# Patient Record
Sex: Male | Born: 1990 | Race: White | Hispanic: No | Marital: Single | Smoking: Never smoker
Health system: Southern US, Community
[De-identification: ages and names within clinical notes are randomized; demographics above are authoritative.]

---

## 2008-04-13 ENCOUNTER — Ambulatory Visit: Payer: Self-pay | Admitting: Unknown Physician Specialty

## 2008-04-20 ENCOUNTER — Ambulatory Visit: Payer: Self-pay | Admitting: Unknown Physician Specialty

## 2009-04-13 ENCOUNTER — Ambulatory Visit: Payer: Self-pay

## 2010-01-17 ENCOUNTER — Ambulatory Visit: Payer: Self-pay | Admitting: General Surgery

## 2010-01-31 ENCOUNTER — Ambulatory Visit: Payer: Self-pay | Admitting: General Surgery

## 2010-08-14 ENCOUNTER — Ambulatory Visit: Payer: Self-pay | Admitting: Internal Medicine

## 2011-09-30 IMAGING — CT CT HEAD WITHOUT CONTRAST
2 series · 16 of 30 positions shown, 20 images · non-contrast
Comparison: none

REASON FOR EXAM: [id number] head contusion
COMMENTS:

PROCEDURE:     CT  - CT HEAD WITHOUT CONTRAST  - August 14, 2010  [DATE]
RESULT:     Comparison:  None
TECHNIQUE: Multiple axial images from the foramen magnum to the vertex were
obtained without IV contrast.

[Series 2: without · axial · non-contrast · 0.43mm/px · z∈[+294,+424]mm · 13 of 32 slices shown, 17 images]
[im 3/32  brain]
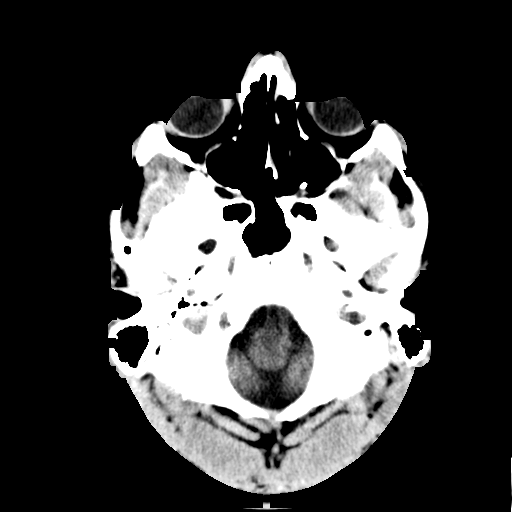
[im 3/32  bone]
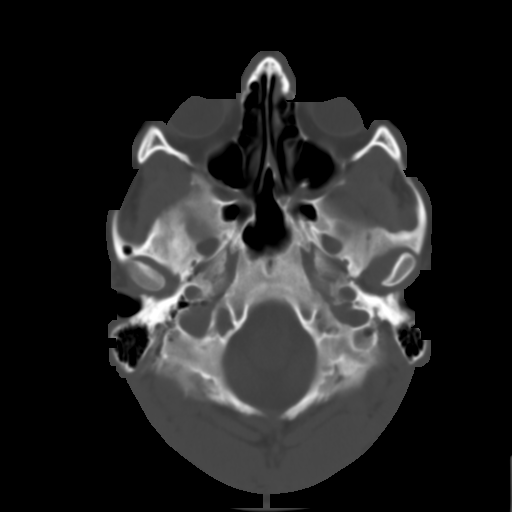
[im 5/32  brain]
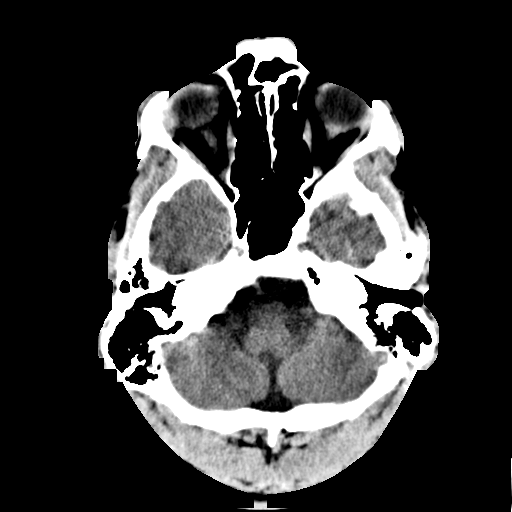
[im 7/32  brain]
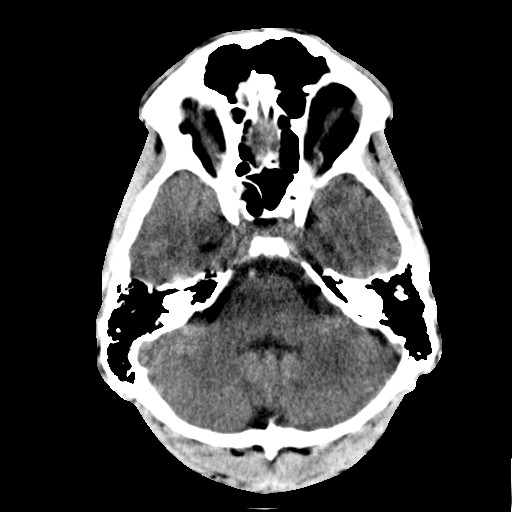
[im 9/32  brain]
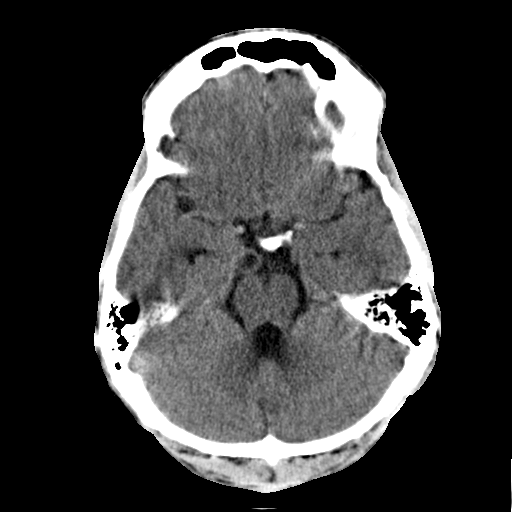
[im 12/32  brain]
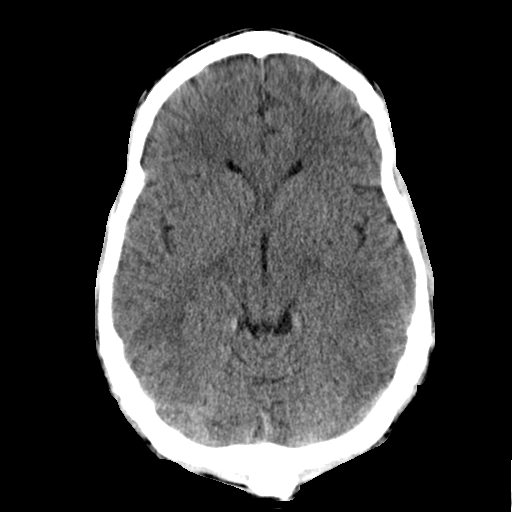
[im 12/32  bone]
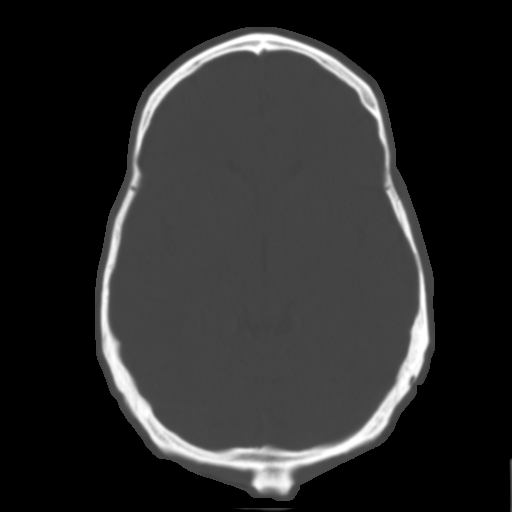
[im 14/32  brain]
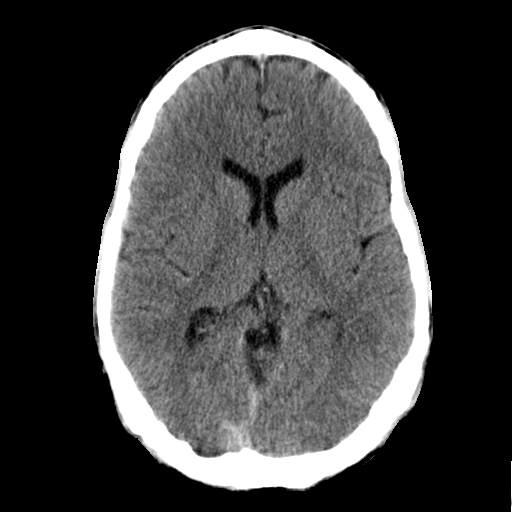
[im 16/32  brain]
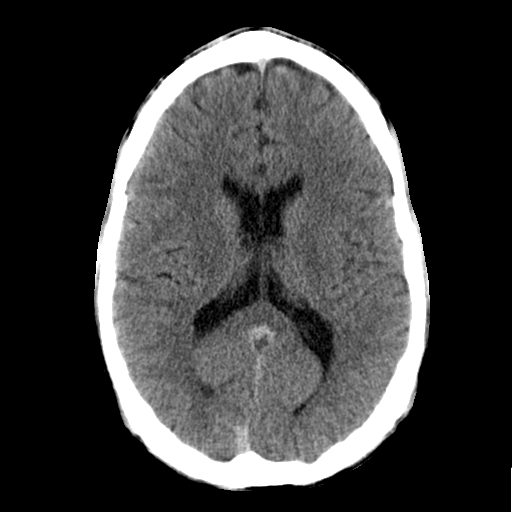
[im 18/32  brain]
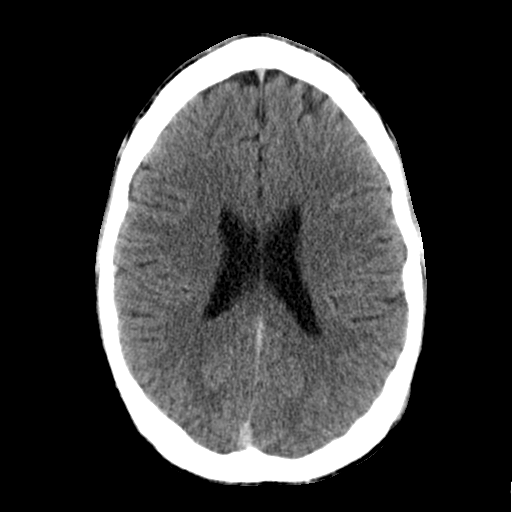
[im 20/32  brain]
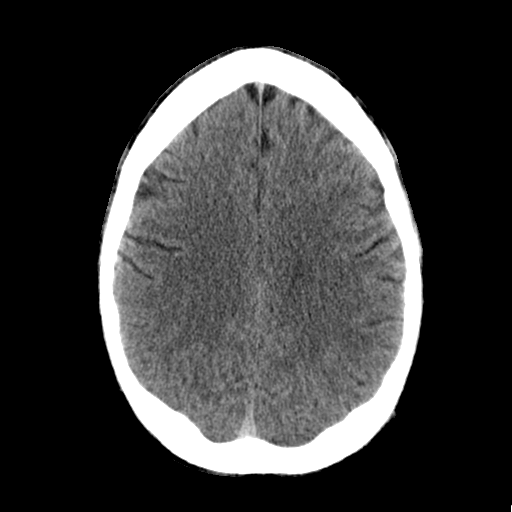
[im 20/32  bone]
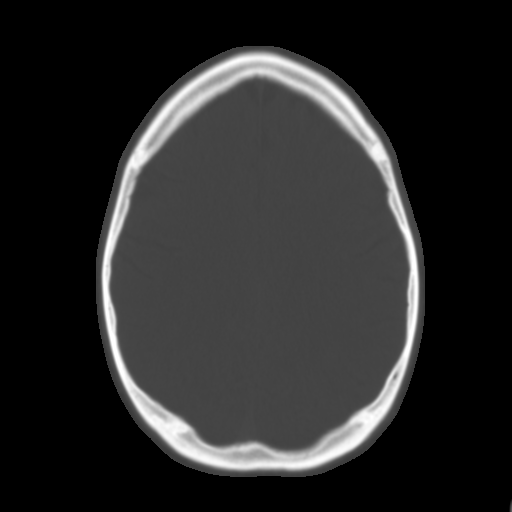
[im 23/32  brain]
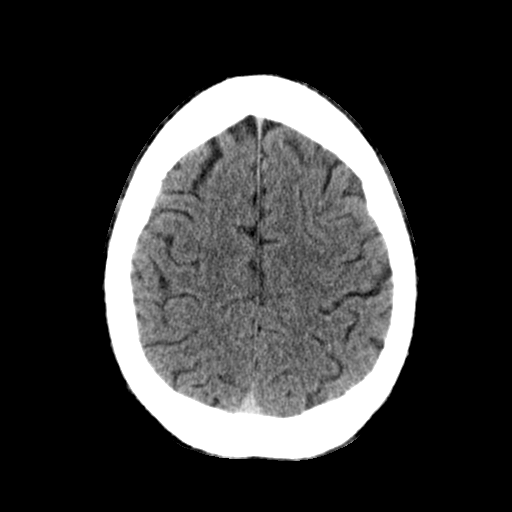
[im 25/32  brain]
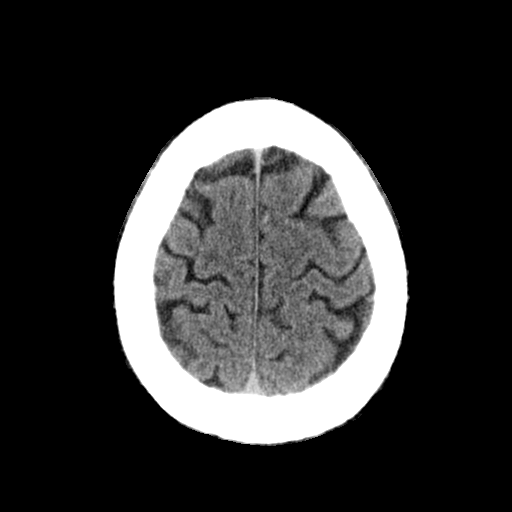
[im 27/32  brain]
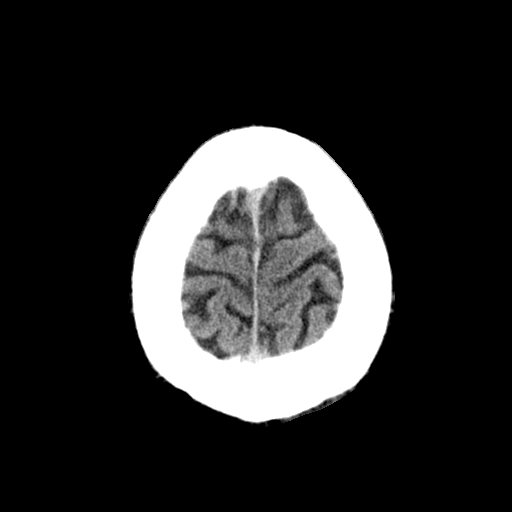
[im 29/32  brain]
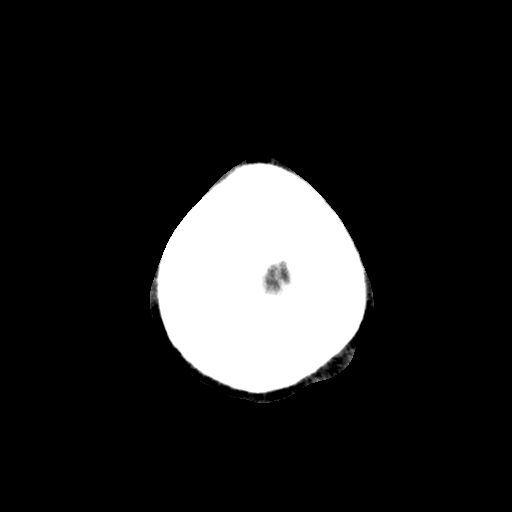
[im 29/32  bone]
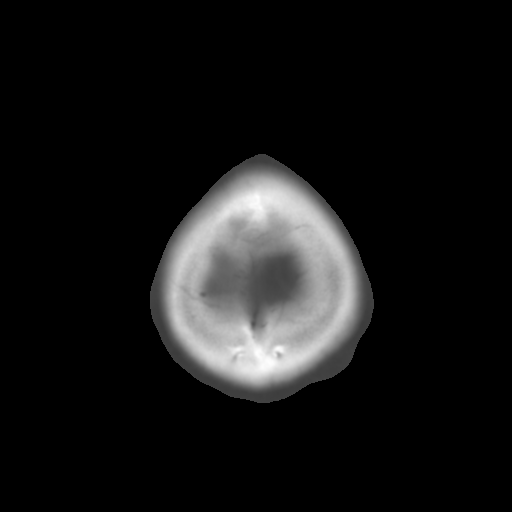

[Series 3: bone · axial · 0.43mm/px · z∈[+294,+338]mm · 3 of 32 slices shown]
[im 3/32  bone]
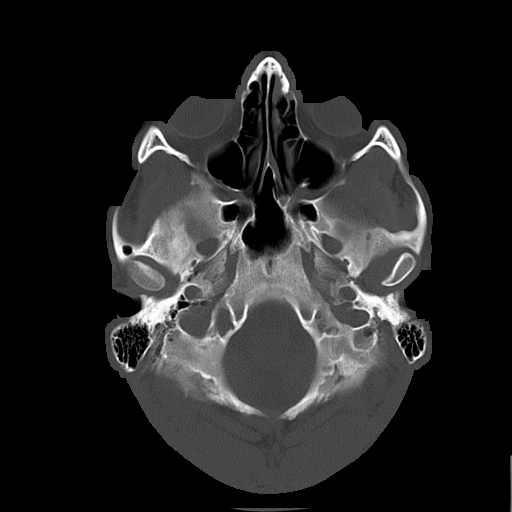
[im 7/32  bone]
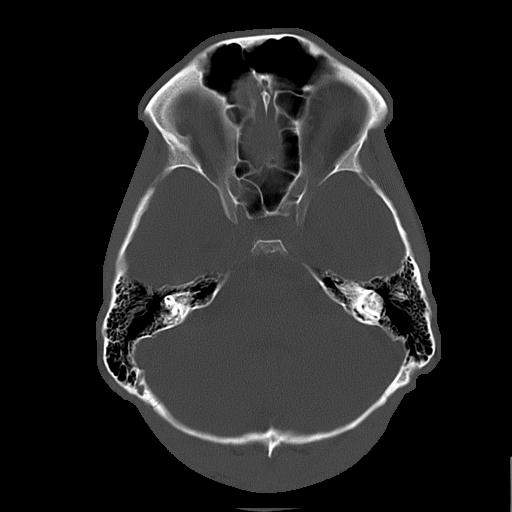
[im 12/32  bone]
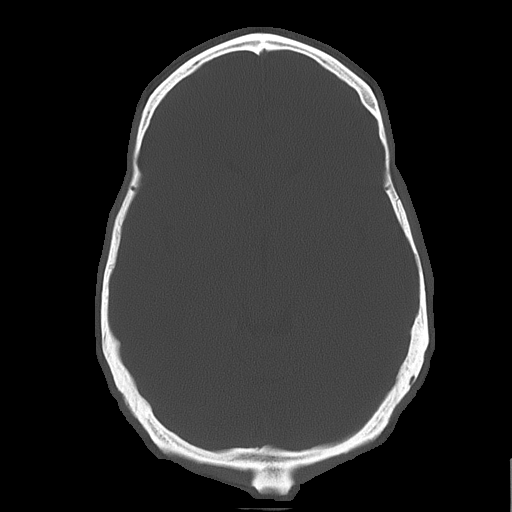

[16 of 30 positions shown; findings below may reference images not displayed]

FINDINGS: There is no evidence of mass effect, midline shift, or extra-axial fluid
collections.  There is no evidence of a space-occupying lesion or
intracranial hemorrhage. There is no evidence of a cortical-based area of
acute infarction.

The ventricles and sulci are appropriate for the patient's age. The basal
cisterns are patent.

Visualized portions of the orbits are unremarkable. The visualized portions
of the paranasal sinuses and mastoid air cells are unremarkable.

The osseous structures are unremarkable.
IMPRESSION: No acute intracranial process.

## 2014-06-21 ENCOUNTER — Other Ambulatory Visit (INDEPENDENT_AMBULATORY_CARE_PROVIDER_SITE_OTHER): Payer: No Typology Code available for payment source

## 2014-06-21 ENCOUNTER — Encounter: Payer: Self-pay | Admitting: *Deleted

## 2014-06-21 ENCOUNTER — Encounter: Payer: Self-pay | Admitting: Family Medicine

## 2014-06-21 ENCOUNTER — Ambulatory Visit (INDEPENDENT_AMBULATORY_CARE_PROVIDER_SITE_OTHER): Payer: No Typology Code available for payment source | Admitting: Family Medicine

## 2014-06-21 VITALS — BP 140/82 | HR 89 | Ht 74.0 in | Wt 215.0 lb

## 2014-06-21 DIAGNOSIS — G5622 Lesion of ulnar nerve, left upper limb: Secondary | ICD-10-CM

## 2014-06-21 DIAGNOSIS — M25522 Pain in left elbow: Secondary | ICD-10-CM

## 2014-06-21 NOTE — Patient Instructions (Signed)
Good to see you.  Happy holidays!  Ice 20 minutes 2 times daily. Usually after activity and before bed. Exercises 3 times a week.   Compression sleeve try at dicks, with activity and when flared up.  Try the pennsaid twice daily.  Consider turmeric 500mg  twice daily  See me again in  3 weeks.

## 2014-06-21 NOTE — Progress Notes (Signed)
  Tawana ScaleZach Issachar Broady D.O. [facility] [address] [place], [place] [address] Phone: [phone]) [phone] Subjective:     CC: left elbow pain  NUU:[id number]:Subjective Victor Mcintosh is a 23 y.o. male coming in with complaint of left elbow pain. Has had it for greater than 6 months. Patient states that it is intermittent. Patient states that it hurts when he hits it just right. Patient does not know the specific area. When it happens it feels like electrical shocks that seems to go down his arm and then states localize. Pain seems to go away after multiple seconds if not minutes. Patient then as a dull ache. Nothing that stops him from any activity. Patient states when the severity occurs at 6 or 7 out of 10. Denies any weakness or numbness in the extremity. Patient does do a lot of manual labor though. Patient is also in school and does put a lot of pressure on his elbows when studying he states. No pain that wakes him up at night.     Past medical history, social, surgical and family history all reviewed in electronic medical record.   Review of Systems: No headache, visual changes, nausea, vomiting, diarrhea, constipation, dizziness, abdominal pain, skin rash, fevers, chills, night sweats, weight loss, swollen lymph nodes, body aches, joint swelling, muscle aches, chest pain, shortness of breath, mood changes.   Objective Blood pressure 140/82, pulse 89, height 6\' 2"  (1.88 m), weight 215 lb (97.523 kg), SpO2 98 %.  General: No apparent distress alert and oriented x3 mood and affect normal, dressed appropriately.  HEENT: Pupils equal, extraocular movements intact  Respiratory: Patient's speak in full sentences and does not appear short of breath  Cardiovascular: No lower extremity edema, non tender, no erythema  Skin: Warm dry intact with no signs of infection or rash on extremities or on axial skeleton.  Abdomen: Soft nontender  Neuro: Cranial nerves II through XII are intact, neurovascularly intact  in all extremities with 2+ DTRs and 2+ pulses.  Lymph: No lymphadenopathy of posterior or anterior cervical chain or axillae bilaterally.  Gait normal with good balance and coordination.  MSK:  Non tender with full range of motion and good stability and symmetric strength and tone of shoulders, wrist, hip, knee and ankles bilaterally.  Elbow:left Unremarkable to inspection. Range of motion full pronation, supination, flexion, extension. Strength is full to all of the above directions Stable to varus, valgus stress. Negative moving valgus stress test. No discrete areas of tenderness to palpation. Ulnar nerve does have subluxation positivecubital tunnel Tinel's.  Musculoskeletal ultrasound was performed and interpreted by Terrilee FilesZach Giordano Getman D.O.   Elbow: left Lateral epicondyle and common extensor tendon origin visualized.  No edema, effusions, or avulsions seen.  Radial head unremarkable and located in annular ligament Medial epicondyle and common flexor tendon origin visualized.  No edema, effusions, or avulsions seen. Ulnar nerve in cubital tunnel unremarkable. Olecranon and triceps insertion visualized and unremarkable without edema, effusion, or avulsion.  No signs olecranon bursitis. Patient's ulnar nerve does have hypoechoic changes. With full extension there is some mild subluxation that occurs. This does cause patient have the symptomatology. Power doppler signal normal.  IMPRESSION:  Ulnar subluxation     Impression and Recommendations:     This case required medical decision making of moderate complexity.

## 2014-06-21 NOTE — Assessment & Plan Note (Signed)
Ulnar subluxation.  Discussed topical and compression.  Icing protocol, topical NSAIDs, and HEP Discussed if not better injections, or surgery but mild F/u in 3 weeks and will consider injection if needed.

## 2014-07-12 ENCOUNTER — Ambulatory Visit: Payer: No Typology Code available for payment source | Admitting: Family Medicine

## 2014-07-12 ENCOUNTER — Telehealth: Payer: Self-pay | Admitting: *Deleted

## 2014-07-12 NOTE — Telephone Encounter (Signed)
[facility] - Client TELEPHONE ADVICE RECORD [facility] Call [facility] Patient Name: OZIAH VITANZA Gender: Male DOB: 1991/04/19 Age: 24 Y 4 M 3 D Return Phone Number: [phone] (Primary) Address: City/State/Zip: [place] [facility] Client Client Site [facility] Physician Terrilee Files Contact Type Call Caller Name Ruford Dudzinski Caller Phone Number [phone] Relationship To Patient Self Is this call to report lab results? No Call Type General Information Initial Comment Caller states he needs to cancel appt 07/12/13 General Information Type Appointment Nurse Assessment Guidelines Guideline Title Affirmed Question Affirmed Notes Nurse Date/Time (Eastern Time) Disp. Time Lamount Cohen Time) Disposition Final User 07/09/2014 8:55:56 AM General Information Provided Yes Chilton Si, Amy After Care Instructions Given Call Event Type User Date / Time Description

## 2015-08-12 ENCOUNTER — Encounter: Payer: Self-pay | Admitting: Emergency Medicine

## 2015-08-12 ENCOUNTER — Ambulatory Visit
Admission: EM | Admit: 2015-08-12 | Discharge: 2015-08-12 | Disposition: A | Discharge status: Home / Self Care | Attending: Family Medicine | Admitting: Family Medicine

## 2015-08-12 DIAGNOSIS — J069 Acute upper respiratory infection, unspecified: Secondary | ICD-10-CM | POA: Diagnosis not present

## 2015-08-12 LAB — RAPID INFLUENZA A&B ANTIGENS (ARMC ONLY)
INFLUENZA A (ARMC): NOT DETECTED
INFLUENZA B (ARMC): NOT DETECTED

## 2015-08-12 LAB — RAPID STREP SCREEN (MED CTR MEBANE ONLY): Streptococcus, Group A Screen (Direct): NEGATIVE

## 2015-08-12 MED ORDER — OSELTAMIVIR PHOSPHATE 75 MG PO CAPS: 75.0000 mg | ORAL_CAPSULE | Freq: Two times a day (BID) | ORAL | Status: AC

## 2015-08-12 MED ORDER — IBUPROFEN 800 MG PO TABS
800.0000 mg | ORAL_TABLET | Freq: Once | ORAL | Status: AC
  Administered 2015-08-12: 800 mg via ORAL

## 2015-08-12 MED ORDER — ACETAMINOPHEN 500 MG PO TABS
1000.0000 mg | ORAL_TABLET | Freq: Once | ORAL | Status: AC
  Administered 2015-08-12: 1000 mg via ORAL

## 2015-08-12 NOTE — Discharge Instructions (Signed)
Take medication as prescribed. Rest. Drink plenty of fluids. Take over the counter tylenol or ibuprofen as needed.  ° °Follow up with your primary care physician this week as needed. Return to Urgent care for new or worsening concerns.  °Viral Infections °A viral infection can be caused by different types of viruses. Most viral infections are not serious and resolve on their own. However, some infections may cause severe symptoms and may lead to further complications. °SYMPTOMS °Viruses can frequently cause: °· Minor sore throat. °· Aches and pains. °· Headaches. °· Runny nose. °· Different types of rashes. °· Watery eyes. °· Tiredness. °· Cough. °· Loss of appetite. °· Gastrointestinal infections, resulting in nausea, vomiting, and diarrhea. °These symptoms do not respond to antibiotics because the infection is not caused by bacteria. However, you might catch a bacterial infection following the viral infection. This is sometimes called a "superinfection." Symptoms of such a bacterial infection may include: °· Worsening sore throat with pus and difficulty swallowing. °· Swollen neck glands. °· Chills and a high or persistent fever. °· Severe headache. °· Tenderness over the sinuses. °· Persistent overall ill feeling (malaise), muscle aches, and tiredness (fatigue). °· Persistent cough. °· Yellow, green, or brown mucus production with coughing. °HOME CARE INSTRUCTIONS  °· Only take over-the-counter or prescription medicines for pain, discomfort, diarrhea, or fever as directed by your caregiver. °· Drink enough water and fluids to keep your urine clear or pale yellow. Sports drinks can provide valuable electrolytes, sugars, and hydration. °· Get plenty of rest and maintain proper nutrition. Soups and broths with crackers or rice are fine. °SEEK IMMEDIATE MEDICAL CARE IF:  °· You have severe headaches, shortness of breath, chest pain, neck pain, or an unusual rash. °· You have uncontrolled vomiting, diarrhea, or you  are unable to keep down fluids. °· You or your child has an oral temperature above 102° F (38.9° C), not controlled by medicine. °· Your baby is older than 3 months with a rectal temperature of 102° F (38.9° C) or higher. °· Your baby is 3 months old or younger with a rectal temperature of 100.4° F (38° C) or higher. °MAKE SURE YOU:  °· Understand these instructions. °· Will watch your condition. °· Will get help right away if you are not doing well or get worse. °  °This information is not intended to replace advice given to you by your health care provider. Make sure you discuss any questions you have with your health care provider. °  °Document Released: 04/04/2005 Document Revised: 09/17/2011 Document Reviewed: 12/01/2014 °Elsevier Interactive Patient Education ©2016 [employer] ° °

## 2015-08-12 NOTE — ED Notes (Signed)
Patient c/o sore throat, fever, bodyaches, and cough that started yesterday.

## 2015-08-12 NOTE — ED Provider Notes (Signed)
[facility]  ____________________________________________  Time seen: Approximately 8:29 PM  I have reviewed the triage vital signs and the nursing notes.   HISTORY  Chief Complaint Cough; Fever; and Sore Throat   HPI ISAAK DELMUNDO is a 25 y.o. male presents with a complaint of sudden onset of runny nose, nasal congestion, sore throat, cough that started yesterday. Also reports accompanying fevers. States sore throat is mild. Reports did not measure temperature at home but reports feels really feeling cold alternated with feeling hot. States generalized body aches at 6 out of 10.   Denies chest pain or shortness breath, abdominal pain, dysuria, neck or back pain. Denies rash. Denies known sick contacts. 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  Reports unrelieved with over-the-counter DayQuil.     History reviewed. No pertinent past medical history.  Patient Active Problem List   Diagnosis Date Noted  . 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 No pertinent past surgical history.  No current outpatient prescriptions on file.  Allergies Review of patient's allergies indicates no known allergies.  History reviewed. No pertinent family history.  Social History Social History  Substance Use Topics  . Smoking status: Never Smoker   . Smokeless tobacco: None  . Alcohol Use: No    Review of Systems Constitutional: positive fevers. Eyes: No visual changes. ENT: positive runny nose, nasal condition, sore throat, positive intermittent cough. Cardiovascular: Denies chest pain. Respiratory: Denies shortness of breath. Gastrointestinal: No abdominal pain.  No nausea, no vomiting.  No diarrhea.  No constipation. Genitourinary: Negative for dysuria. Musculoskeletal: Negative for back pain. Skin: Negative for rash. Neurological: Negative for headaches, focal weakness or numbness.  10-point ROS otherwise  negative.  ____________________________________________   PHYSICAL EXAM:  VITAL SIGNS: ED Triage Vitals  Enc Vitals Group     BP 08/12/15 2006 143/87 mmHg     Pulse Rate 08/12/15 2006 133     Resp 08/12/15 2006 16     Temp 08/12/15 2006 102.5 F (39.2 C)     Temp Source 08/12/15 2006 Tympanic     SpO2 08/12/15 2006 100 %     Weight 08/12/15 2006 210 lb (95.255 kg)     Height 08/12/15 2006  (1.88 m)     Head Cir --      Peak Flow --      Pain Score 08/12/15 2008 6     Pain Loc --      Pain Edu? --      Excl. in GC? --    Today's Vitals   08/12/15 2008 08/12/15 2110 08/12/15 2145 08/12/15 2152  BP:      Pulse:   120   Temp:  102.1 F (38.9 C) 101.5 F (38.6 C)   TempSrc:  Tympanic Tympanic   Resp:      Height:      Weight:      SpO2:      PainSc: 6    3      Constitutional: Alert and oriented. Well appearing and in no acute distress. Eyes: Conjunctivae are normal. PERRL. EOMI. Head: Atraumatic. nontender. No swelling. No erythema.   Ears: no erythema, normal TMs bilaterally.   Nose: nasal congestion with clear rhinorrhea. Mouth/Throat: Mucous membranes are moist. Mild pharyngeal erythema. No tonsillar swelling or exudate. Neck: No stridor.  No cervical spine tenderness to palpation. Hematological/Lymphatic/Immunilogical: No cervical lymphadenopathy. Cardiovascular: Normal rate, regular rhythm. Grossly normal heart sounds.  Good peripheral circulation. Respiratory: Normal respiratory effort.  No retractions. Lungs CTAB. No wheezes, rales or rhonchi. Good air movement.  Gastrointestinal: Soft and nontender. Normal Bowel sounds.  Musculoskeletal: No lower or upper extremity tenderness nor edema. No cervical, thoracic or lumbar tenderness to palpation. Neurologic:  Normal speech and language. No gross focal neurologic deficits are appreciated. No gait instability. No meningismus.  Skin:  Skin is warm, dry and intact. No rash noted. Psychiatric: Mood and affect  are normal. Speech and behavior are normal.  ____________________________________________   LABS (all labs ordered are listed, but only abnormal results are displayed)  Labs Reviewed  RAPID INFLUENZA A&B ANTIGENS ([facility] ONLY)  RAPID STREP SCREEN (NOT AT [facility])  CULTURE, GROUP A STREP [facility])     INITIAL IMPRESSION / ASSESSMENT AND PLAN / ED COURSE  Pertinent labs & imaging results that were available during my care of the patient were reviewed by me and considered in my medical decision making (see chart for details).  Very well-appearing patient. No acute distress. Presents for complaints of one day of runny nose, nasal congestion, sore throat, cough, body aches and fevers. Reports has continued to eat and drink well. Actively drinking fluids in urgent care. Febrile and tachycardic at this time. Oral 800 mg  Ibuprofen given urgent care. Drink fluids at this time. Suspect viral infection, such as influenza. Will evaluate influenza, quick strep and continue to monitor.  Quick strep negative, will culture. Rapid influenza negative, though patient clinical appearance suggestive of influenza.  Tylenol 1 gm also given orally in urgent care. Drinking fluids well. Continue to monitor.  2150: Reports feeling much improved and states ready to go home. Well appearing. Laughing and smiling in room. Discussed supportive treatments including rest, fluids, prn otc ibuprofen and tylenol, will treat with oral tamiflu, and close pcp follow up.   Discussed follow up with Primary care physician this week. Discussed follow up and return parameters to urgent care or ER including fever not responding to medications, difficulty eating or drinking, abdominal pain, no resolution or any worsening concerns. Patient verbalized understanding and agreed to plan.   ____________________________________________   FINAL CLINICAL IMPRESSION(S) / ED DIAGNOSES  Final diagnoses:  Viral upper respiratory infection       Note: This dictation was prepared with Dragon dictation along with smaller phrase technology. Any transcriptional errors that result from this process are unintentional.    Renford Dills, NP 08/12/15 2155

## 2015-08-15 LAB — CULTURE, GROUP A STREP (THRC)
